# Patient Record
Sex: Female | Born: 1935 | Race: Black or African American | Hispanic: No | Smoking: Never smoker
Health system: Southern US, Community
[De-identification: ages and names within clinical notes are randomized; demographics above are authoritative.]

## PROBLEM LIST (undated history)

## (undated) DIAGNOSIS — M1990 Unspecified osteoarthritis, unspecified site: Secondary | ICD-10-CM

## (undated) DIAGNOSIS — D649 Anemia, unspecified: Secondary | ICD-10-CM

## (undated) DIAGNOSIS — I10 Essential (primary) hypertension: Secondary | ICD-10-CM

## (undated) HISTORY — DX: Essential (primary) hypertension: I10

## (undated) HISTORY — DX: Anemia, unspecified: D64.9

## (undated) HISTORY — DX: Unspecified osteoarthritis, unspecified site: M19.90

## (undated) HISTORY — PX: JOINT REPLACEMENT: SHX530

---

## 2008-12-12 HISTORY — PX: KNEE SURGERY: SHX244

## 2009-01-15 ENCOUNTER — Inpatient Hospital Stay (HOSPITAL_COMMUNITY): Admission: RE | Admit: 2009-01-15 | Discharge: 2009-01-19 | Payer: Self-pay | Admitting: Orthopedic Surgery

## 2009-02-10 ENCOUNTER — Encounter: Admission: RE | Admit: 2009-02-10 | Discharge: 2009-05-11 | Payer: Self-pay | Admitting: Orthopedic Surgery

## 2011-03-29 LAB — CBC
Hemoglobin: 7.8 g/dL — CL (ref 12.0–15.0)
Hemoglobin: 8.7 g/dL — ABNORMAL LOW (ref 12.0–15.0)
MCHC: 32.2 g/dL (ref 30.0–36.0)
MCHC: 33.7 g/dL (ref 30.0–36.0)
MCV: 88.6 fL (ref 78.0–100.0)
MCV: 88.8 fL (ref 78.0–100.0)
Platelets: 119 10*3/uL — ABNORMAL LOW (ref 150–400)
Platelets: 134 10*3/uL — ABNORMAL LOW (ref 150–400)
Platelets: 143 10*3/uL — ABNORMAL LOW (ref 150–400)
Platelets: 230 10*3/uL (ref 150–400)
RDW: 13.3 % (ref 11.5–15.5)
RDW: 13.3 % (ref 11.5–15.5)
RDW: 13.6 % (ref 11.5–15.5)
WBC: 7.3 10*3/uL (ref 4.0–10.5)
WBC: 8.3 10*3/uL (ref 4.0–10.5)

## 2011-03-29 LAB — BASIC METABOLIC PANEL
BUN: 20 mg/dL (ref 6–23)
BUN: 7 mg/dL (ref 6–23)
BUN: 8 mg/dL (ref 6–23)
BUN: 9 mg/dL (ref 6–23)
CO2: 27 mEq/L (ref 19–32)
CO2: 28 mEq/L (ref 19–32)
Calcium: 8.4 mg/dL (ref 8.4–10.5)
Calcium: 8.5 mg/dL (ref 8.4–10.5)
Calcium: 8.6 mg/dL (ref 8.4–10.5)
Calcium: 9.6 mg/dL (ref 8.4–10.5)
Chloride: 97 mEq/L (ref 96–112)
Chloride: 99 mEq/L (ref 96–112)
Creatinine, Ser: 0.72 mg/dL (ref 0.4–1.2)
Creatinine, Ser: 0.8 mg/dL (ref 0.4–1.2)
Creatinine, Ser: 0.84 mg/dL (ref 0.4–1.2)
GFR calc Af Amer: >60 mL/min (ref >60)
GFR calc Af Amer: >60 mL/min (ref >60)
GFR calc non Af Amer: >60 mL/min (ref >60)
GFR calc non Af Amer: >60 mL/min (ref >60)
Glucose, Bld: 108 mg/dL — ABNORMAL HIGH (ref 70–99)
Glucose, Bld: 130 mg/dL — ABNORMAL HIGH (ref 70–99)
Sodium: 132 mEq/L — ABNORMAL LOW (ref 135–145)

## 2011-03-29 LAB — PROTIME-INR
INR: 1.2 (ref 0.00–1.49)
INR: 1.5 (ref 0.00–1.49)
INR: 1.6 — ABNORMAL HIGH (ref 0.00–1.49)
Prothrombin Time: 14.9 seconds (ref 11.6–15.2)
Prothrombin Time: 15.6 seconds — ABNORMAL HIGH (ref 11.6–15.2)
Prothrombin Time: 19.1 seconds — ABNORMAL HIGH (ref 11.6–15.2)
Prothrombin Time: 21.7 seconds — ABNORMAL HIGH (ref 11.6–15.2)

## 2011-03-29 LAB — NO BLOOD PRODUCTS

## 2011-04-04 ENCOUNTER — Encounter: Payer: Self-pay | Admitting: Family Medicine

## 2011-04-04 DIAGNOSIS — D649 Anemia, unspecified: Secondary | ICD-10-CM

## 2011-04-04 DIAGNOSIS — I10 Essential (primary) hypertension: Secondary | ICD-10-CM | POA: Insufficient documentation

## 2011-04-26 NOTE — Discharge Summary (Signed)
Michelle Rangel, Michelle Rangel              ACCOUNT NO.:  [id number]   MEDICAL RECORD NO.:  [id number]          PATIENT TYPE:  INP   LOCATION:  5025                         FACILITY:  [facility]   PHYSICIAN:  Vania Rea. Supple, M.D.  DATE OF BIRTH:  01-05-1931   DATE OF ADMISSION:  01/15/2009  DATE OF DISCHARGE:  01/19/2009                               DISCHARGE SUMMARY   ADMISSION DIAGNOSES:  1. End-stage osteoarthrosis of left greater than right knee.  2. Hypertension.  3. The patient is Jehovah's Witness, therefore, no blood products.   DISCHARGE DIAGNOSES:  1. End-stage osteoarthrosis of left greater than right knee.  2. Hypertension.  3. The patient is Jehovah's Witness, therefore, no blood products.  4. Status post left total knee arthroplasty.  5. Postoperative hemorrhagic anemia, stable without transfusion per      patient's wishes.   OPERATION:  Left total knee arthroplasty.   SURGEON:  Vania Rea. Supple, MD   ASSISTANT:  Lucita Lora. Shuford, PA-C   ANESTHESIA:  General anesthetic with a femoral nerve block.   BRIEF HISTORY:  Michelle Rangel is a pleasant 77 year old female, who is  Jehovah's Witness, got end-stage osteoarthrosis of left knee.  She has  failed outpatient conservative measures and has had bone-on-bone  demonstrated on plain film x-rays.  At this time, total knee  arthroplasty is indicated due to her functional disabilities.  We  discussed options of total knee arthroplasty and she again expressed her  wishes for no blood products and we have agreed and wished to proceed.   HOSPITAL COURSE:  The patient was admitted and underwent the above-named  procedure and tolerated this well.  All appropriate IV antibiotics and  analgesics were utilized.  Postoperatively, she was placed on DVT and PE  prophylaxis with Coumadin as well as placed in physical therapy,  weightbearing as tolerated per total knee replacement protocol.  The  patient did experience a postoperative hemorrhagic  anemia first day to  8.7, second day to 7.6 and stabilized at 7.8.  She was minimally  symptomatic.  She did began working with formal physical therapy.  She  was placed on iron supplements.  She was noted to be slow to progress  initially, however, through days 3 and 4 postoperatively, she progressed  to the point where she was stable for discharge on date January 19, 2009.  On that date, she was afebrile.  Vital signs were stable.  She  was comfortable.  Incision was clean and dry.  She was ready for  discharge to home with Home Health, PT, OT, and care of her family.   LABORATORY DATA:  The above-named hemoglobins as above.  Otherwise,  protimes and INRs noted in the chart for monitoring of her Coumadin.  Chemistries showed within normal limits except for mild hyponatremia to  130-134 and borderline potassium at 3.4.  EKG in the chart showed normal  sinus rhythm with left ventricular hypertrophy, no old EKG to compare  to.   CONDITION ON DISCHARGE:  Stable and improved.   DISCHARGE MEDICATIONS AND PLAN:  The patient had been discharged to  home  with care of her family.  She will be on iron, which she does take on a  chronic basis.  She has Home Health Services with [facility] arranged for  home health PT, OT, RN.  Percocet and Robaxin and Coumadin prescriptions  are left.  Follow up in our office in 2 weeks.  May shower on day 5.   CONDITION ON DISCHARGE:  Stable and improved.  Resume other home meds  and home diet.      Tracy A. Shuford, P.A.-C.      Vania Rea. Supple, M.D.  Electronically Signed    TAS/MEDQ  D:  02/26/2009  T:  02/26/2009  Job:  [id number]

## 2011-04-26 NOTE — Op Note (Signed)
Michelle Rangel, Michelle Rangel              ACCOUNT NO.:  [id number]   MEDICAL RECORD NO.:  [id number]          PATIENT TYPE:  INP   LOCATION:  2899                         FACILITY:  [facility]   PHYSICIAN:  Vania Rea. Supple, M.D.  DATE OF BIRTH:  Aug 02, 1931   DATE OF PROCEDURE:  01/15/2009  DATE OF DISCHARGE:                               OPERATIVE REPORT   PREOPERATIVE DIAGNOSIS:  Left knee osteoarthrosis.   POSTOPERATIVE DIAGNOSIS:  Left knee osteoarthrosis.   PROCEDURE:  Cemented left total knee arthroplasty utilizing a DePuy  Sigma implant, a size 2 femur, a size 2.5 tibia, a 32 patellar button,  and a 12.5 rotating platform polyethylene insert.   SURGEON:  Vania Rea. Supple, MD   ASSISTANT:  Ralene Bathe, PA-C   ANESTHESIA:  General endotracheal as well as a femoral nerve block.   TOURNIQUET TIME:  46 minutes.   HEMOVAC:  Times one.   ESTIMATED BLOOD LOSS:  50 mL.   HISTORY:  Michelle Rangel is a 77 year old Jehovah's Witness who has end-stage  bilateral knee arthrosis, left symptomatically and radiographically more  severe than the right.  Due to ongoing pain and functional limitation,  she is brought to the operating room at this time for planned left total  knee arthroplasty.   We counseled Ms. Mcconathy and her family members regarding treatment  options and risks versus benefits thereof.  Possible surgical  complications including infection, neurovascular injury, DVT, PE,  persistent pain, and potential for acute blood loss and associated  complications were reviewed.  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  She understands and accepts and agrees with  plan for total knee arthroplasty.   PROCEDURE IN DETAIL:  After undergoing routine preop evaluation, the  patient was brought to the holding area and a femoral nerve block was  established by the Anesthesia Department.  She received prophylactic  antibiotics.  Brought to the operating room, placed supine  on the  operating table, and underwent smooth induction of general endotracheal  anesthesia.  Tourniquet applied to the left thigh and left leg was  sterilely prepped and draped in standard fashion.  Leg was exsanguinated  with the tourniquet inflated to 350 mmHg.  An anterior midline incision  was then made, centered over the patella approximately 15 cm in length.  Skin flaps were elevated and mobilized and the medial parapatellar  arthrotomy was then performed.  Patella was everted and the fat pad was  excised.  We performed a medial release of the tibia due to the varus  deformity and a large osteophyte was identified and resected on the  medial tibial plateau.  Patella was everted, knee was then flexed up,  and a drill was used to gain access into the femoral medullary canal.  An intramedullary guide was then passed and 11-mm resection was made  from the distal femur in a 5-degree valgus cut.  Distal femur was then  measured with a size 2 showing the best fit.  The size 2 cutting guide  was applied and we made the anterior, posterior, and chamfer cuts  on the  distal femur.  The size 2 femoral trial showed excellent fit.  Our  attention was then turned to the proximal tibia where we applied the  extramedullary guide and made a resection measuring 10 mm off the  lateral plateau using an oscillating saw and maintaining proper overall  alignment.  The menisci were removed prior to the proximal tibial  resection.  We then sized the proximal tibia which showed best coverage  with the 2.5 implant.  The 2.5 trial was pinned into position and then  we made a trial reduction that showed excellent soft tissue balance and  excellent knee motion with good stability.  We then completed the  preparation of proximal tibia with a reamer and the broach.  Attention  was returned to the distal femur where we used a box-cutting guide to  make a box cut and then performed a repeat trial with the boxed  implant  and this showed excellent fit and good soft tissue balance.  At this  point, we turned our attention to the patella which was exposed  circumferentially and measured and an 8-mm resection was performed and  the stabilizing drill holes were then placed with a 32-mm patellar  button.  At this point, the joint was copiously irrigated.  We  meticulously debrided the residual aspects of the menisci from the  posterior compartment and confirmed there were no residual osteophytes.  Soft tissue balance was confirmed.  The joint was pulsatile lavaged and  meticulously cleaned.  The cement was mixed and at the appropriate  consistency, all the implants were then cemented into position.  Meticulous removal of all residual cement was completed.  We then  performed repeat trial reductions and the 12.5 implant showed the best  soft tissue balance and full knee extension.  The final 12.5 mm implant  was placed and the knee was taken through range of motion showing  excellent mobility and instability.  At this point, the tourniquet was  then let down.  Hemostasis was obtained.  Hemovac drain was brought out  superolaterally.  The parapatellar arthrotomy was closed with  interrupted figure-of-eight #1 Vicryl sutures, 2-0 Vicryl was used to  close the subcu layer, and an intracuticular 3-0 Monocryl for the skin  followed by Steri-Strips.  A dry dressing was placed over the left knee  and leg was wrapped with Ace bandage and thigh high support stocking.  The patient was then awakened, extubated, and taken to the recovery room  in stable condition.      Vania Rea. Supple, M.D.  Electronically Signed     KMS/MEDQ  D:  01/15/2009  T:  01/16/2009  Job:  [id number]

## 2011-07-01 LAB — BASIC METABOLIC PANEL
Glucose: 104 mg/dL
Potassium: 4.7 mmol/L (ref 3.4–5.3)

## 2011-07-01 LAB — HEPATIC FUNCTION PANEL
ALT: 14 U/L (ref 7–35)
AST: 21 U/L (ref 13–35)

## 2011-07-12 ENCOUNTER — Encounter (INDEPENDENT_AMBULATORY_CARE_PROVIDER_SITE_OTHER): Payer: Self-pay | Admitting: *Deleted

## 2011-07-19 ENCOUNTER — Encounter (INDEPENDENT_AMBULATORY_CARE_PROVIDER_SITE_OTHER): Payer: Self-pay

## 2011-08-18 ENCOUNTER — Encounter (INDEPENDENT_AMBULATORY_CARE_PROVIDER_SITE_OTHER): Payer: Self-pay | Admitting: Internal Medicine

## 2011-08-18 ENCOUNTER — Ambulatory Visit (INDEPENDENT_AMBULATORY_CARE_PROVIDER_SITE_OTHER): Payer: Medicare Other | Admitting: Internal Medicine

## 2011-08-18 VITALS — BP 132/74 | HR 68 | Temp 97.0°F | Ht 62.0 in | Wt 163.3 lb

## 2011-08-18 DIAGNOSIS — D649 Anemia, unspecified: Secondary | ICD-10-CM

## 2011-08-18 NOTE — Progress Notes (Signed)
Subjective:     Patient ID: Michelle Rangel, female   DOB: 1931-06-23, 77 y.o.   MRN: [id number]  HPI  Michelle Rangel is a referral from Dr. Modesto Charon at [facility] for anemia.  She denies past history of anemia.  However back in February of 2010 H and H 7.8 and 22.9. 07/01/2011 H and H 10.7 and 32.0, MCV 89.5, Her fecal occult blood was negative. She is a Jehovah Witness.  No NSAIDS. No acid reflux. She denies weight loss. Appetite is average.  Denies abdominal pain.  She has a BM about 2 a week.  No melena. She tells me she saw blood in the commode after having a BM a few days.  Her last colonoscopy was 5 yrs ago in [place], [place], and it was normal.    Review of Systems  See hpi     Current Outpatient Prescriptions  Medication Sig Dispense Refill  . folic acid (FOLVITE) 1 MG tablet Take 800 mg by mouth daily.        Marland Kitchen glucosamine-chondroitin 500-400 MG tablet Take 1 tablet by mouth 3 (three) times daily.        Marland Kitchen loratadine (CLARITIN) 10 MG tablet Take 10 mg by mouth daily.        . hydrochlorothiazide 25 MG tablet Take 25 mg by mouth daily.         Past Medical History  Diagnosis Date  . Hypertension   . Anemia    Past Surgical History  Procedure Date  . Knee surgery 2010    Left total knee arthroplasty in 2008 or 2009   History   Social History Narrative  . No narrative on file   History   Social History  . Marital Status: Widowed    Spouse Name: N/A    Number of Children: N/A  . Years of Education: N/A   Occupational History  . Not on file.   Social History Main Topics  . Smoking status: Never Smoker   . Smokeless tobacco: Not on file  . Alcohol Use: No  . Drug Use: No  . Sexually Active: Not on file   Other Topics Concern  . Not on file   Social History Narrative  . No narrative on file   No family history on file.  Family Status  Relation Status Death Age  . Mother Deceased     DM  . Father Deceased     CVA  . Sister Other     Two alive in good  health. One deceased from an MI, One deceaed unknown cause.  . Child Alive     One son had a CVA, One son is on dialysis and is a diabetic  , One  has  had a stroke. One in good health.      Objective:   Physical Exam Filed Vitals:   08/18/11 1041  BP: 132/74  Pulse: 68  Temp: 97 F (36.1 C)   Filed Vitals:   08/18/11 1041  Height: 5\' 2"  (1.575 m)  Weight: 163 lb 4.8 oz (74.072 kg)    Alert and oriented. Skin warm and dry. Oral mucosa is moist.. Sclera anicteric, conjunctivae is pink. Thyroid not enlarged. No cervical lymphadenopathy. Lungs clear. Heart regular rate and rhythm.  Abdomen is soft. Bowel sounds are positive. No hepatomegaly. No abdominal masses felt. No tenderness.   Stool brown and guaiac negative.  No edema to lower extremities. Patient is alert and oriented.     Assessment:  Anemia. Stool guaiac negative. This does not appear to be a GI bleed.     Plan:    Iron studies.  Further recommendations once we have the iron studies. At present their is no indication for an EGD or a colonoscopy.  I discussed this case with Dr. Karilyn Cota.

## 2011-08-18 NOTE — Patient Instructions (Signed)
Pending Lab work.

## 2011-08-24 NOTE — Progress Notes (Signed)
3 mtth fu recall

## 2011-11-23 ENCOUNTER — Encounter (INDEPENDENT_AMBULATORY_CARE_PROVIDER_SITE_OTHER): Payer: Self-pay | Admitting: Internal Medicine

## 2011-11-23 ENCOUNTER — Ambulatory Visit (INDEPENDENT_AMBULATORY_CARE_PROVIDER_SITE_OTHER): Payer: Medicare Other | Admitting: Internal Medicine

## 2011-11-23 VITALS — BP 122/80 | HR 66 | Temp 98.1°F | Ht 62.0 in | Wt 163.5 lb

## 2011-11-23 DIAGNOSIS — D649 Anemia, unspecified: Secondary | ICD-10-CM

## 2011-11-23 NOTE — Progress Notes (Signed)
Subjective:     Patient ID: Michelle Rangel, female   DOB: 1931-10-13, 77 y.o.   MRN: [id number]  HPI Michelle Rangel is a 77 yr old black female here for a scheduled visit.  She was seen for hx of anemia. Her last visit she denied past hx of anemia.  In February of 2010 H and H 7.8 and 228.9, H and H 07/01/2011 10.7 and 32.0.  She is a Jehovah Witness.  Last colonoscopy was 5 yrs ago in [place], [place] and she reports it was normal. Appetite is good. No weight loss. She occasionally has abdominal pain.  She ha a BM about usually once a day. No bright red rectal bleeding or melena. She lives with her sister who is also seen for anemia.     Review of Systems  See hpi    Current Outpatient Prescriptions  Medication Sig Dispense Refill  . folic acid (FOLVITE) 1 MG tablet Take 800 mg by mouth daily.        Marland Kitchen glucosamine-chondroitin 500-400 MG tablet Take 1 tablet by mouth 3 (three) times daily.        . hydrochlorothiazide 25 MG tablet Take 25 mg by mouth daily.        Marland Kitchen loratadine (CLARITIN) 10 MG tablet Take 10 mg by mouth daily.         Past Medical History  Diagnosis Date  . Hypertension   . Anemia    Past Surgical History  Procedure Date  . Knee surgery 2010    Left total knee arthroplasty in 2008 or 2009   History   Social History  . Marital Status: Widowed    Spouse Name: N/A    Number of Children: N/A  . Years of Education: N/A   Occupational History  . Not on file.   Social History Main Topics  . Smoking status: Never Smoker   . Smokeless tobacco: Not on file  . Alcohol Use: No  . Drug Use: No  . Sexually Active: Not on file   Other Topics Concern  . Not on file   Social History Narrative  . No narrative on file   Family Status  Relation Status Death Age  . Mother Deceased     DM  . Father Deceased     CVA  . Sister Other     Two alive in good health. One deceased from an MI, One deceaed unknown cause.  . Child Alive     One son had a CVA, One son is on  dialysis and is a diabetic  , One  has  had a stroke. One in good health.    Allergies  Allergen Reactions  . Robitussin (Altarussin)     Objective:   Physical Exam  Filed Vitals:   11/23/11 1456  BP: 122/80  Pulse: 66  Temp: 98.1 F (36.7 C)  Height: 5\' 2"  (1.575 m)  Weight: 163 lb 8 oz (74.163 kg)    Alert and oriented. Skin warm and dry. Oral mucosa is moist. Upper dentures.. Sclera anicteric, conjunctivae is pink. Thyroid not enlarged. No cervical lymphadenopathy. Lungs clear. Heart regular rate and rhythm.  Abdomen is soft. Bowel sounds are positive. No hepatomegaly. No abdominal masses felt. No tenderness.  No edema to lower extremities. Patient is alert and oriented.      Assessment:   Anemia.  No GI symptoms. Patient is a Consulting civil engineer.  I spoke with Dr. Karilyn Cota in September about this case. Will monitor H  and H. Stools brown and guaiac negative.     Plan:   CBC today. OV in 6 months.

## 2011-11-23 NOTE — Patient Instructions (Signed)
OV in 6 months with a cbc

## 2011-11-24 LAB — CBC WITH DIFFERENTIAL/PLATELET
Eosinophils Relative: 5 % (ref 0–5)
HCT: 35.3 % — ABNORMAL LOW (ref 36.0–46.0)
Hemoglobin: 10.8 g/dL — ABNORMAL LOW (ref 12.0–15.0)
Lymphocytes Relative: 33 % (ref 12–46)
Lymphs Abs: 1.7 10*3/uL (ref 0.7–4.0)
MCV: 88.5 fL (ref 78.0–100.0)
Monocytes Absolute: 0.3 10*3/uL (ref 0.1–1.0)
Platelets: 230 10*3/uL (ref 150–400)
RBC: 3.99 MIL/uL (ref 3.87–5.11)
WBC: 5.1 10*3/uL (ref 4.0–10.5)

## 2011-12-01 ENCOUNTER — Telehealth (INDEPENDENT_AMBULATORY_CARE_PROVIDER_SITE_OTHER): Payer: Self-pay | Admitting: *Deleted

## 2011-12-01 DIAGNOSIS — D649 Anemia, unspecified: Secondary | ICD-10-CM

## 2011-12-01 NOTE — Telephone Encounter (Signed)
Per Delrae Rend the patient will need a CBC in 6 months

## 2012-05-11 ENCOUNTER — Other Ambulatory Visit (INDEPENDENT_AMBULATORY_CARE_PROVIDER_SITE_OTHER): Payer: Self-pay | Admitting: *Deleted

## 2012-05-11 ENCOUNTER — Encounter (INDEPENDENT_AMBULATORY_CARE_PROVIDER_SITE_OTHER): Payer: Self-pay | Admitting: *Deleted

## 2012-05-11 DIAGNOSIS — D649 Anemia, unspecified: Secondary | ICD-10-CM

## 2012-05-18 ENCOUNTER — Encounter (INDEPENDENT_AMBULATORY_CARE_PROVIDER_SITE_OTHER): Payer: Self-pay

## 2012-05-23 ENCOUNTER — Encounter (INDEPENDENT_AMBULATORY_CARE_PROVIDER_SITE_OTHER): Payer: Self-pay | Admitting: *Deleted

## 2012-05-30 ENCOUNTER — Ambulatory Visit (INDEPENDENT_AMBULATORY_CARE_PROVIDER_SITE_OTHER): Payer: Medicare Other | Admitting: Internal Medicine

## 2012-05-31 ENCOUNTER — Ambulatory Visit (INDEPENDENT_AMBULATORY_CARE_PROVIDER_SITE_OTHER): Payer: Medicare Other | Admitting: Internal Medicine

## 2012-05-31 ENCOUNTER — Encounter (INDEPENDENT_AMBULATORY_CARE_PROVIDER_SITE_OTHER): Payer: Self-pay | Admitting: Internal Medicine

## 2012-05-31 ENCOUNTER — Telehealth (INDEPENDENT_AMBULATORY_CARE_PROVIDER_SITE_OTHER): Payer: Self-pay | Admitting: *Deleted

## 2012-05-31 VITALS — BP 122/84 | HR 60 | Temp 98.1°F | Ht 62.0 in | Wt 162.9 lb

## 2012-05-31 DIAGNOSIS — D649 Anemia, unspecified: Secondary | ICD-10-CM

## 2012-05-31 LAB — CBC WITH DIFFERENTIAL/PLATELET
Basophils Relative: 1 % (ref 0–1)
HCT: 32.8 % — ABNORMAL LOW (ref 36.0–46.0)
Hemoglobin: 10.5 g/dL — ABNORMAL LOW (ref 12.0–15.0)
MCHC: 32 g/dL (ref 30.0–36.0)
Monocytes Absolute: 0.2 10*3/uL (ref 0.1–1.0)
Monocytes Relative: 6 % (ref 3–12)
Neutro Abs: 1.9 10*3/uL (ref 1.7–7.7)

## 2012-05-31 NOTE — Telephone Encounter (Signed)
Per Delrae Rend the patient will need to have lab work in 6 months This is noted for 11-29-2012 the same day as the patient's office visit. This is per the patient 's request.

## 2012-05-31 NOTE — Patient Instructions (Addendum)
CBC today. OV in 6 months with a CBC

## 2012-05-31 NOTE — Progress Notes (Signed)
Subjective:     Patient ID: Michelle Rangel, female   DOB: 10-26-1931, 78 y.o.   MRN: [id number]  HPI Michelle Rangel is a 78 yr old female here today for a scheduled visit. She has a hx of anemia.  In February of 2010 H and H 7.8.  07/01/2011 H and H 10.7 and 32.0. She is a Jehovah Witness. Her last colonoscopy was 5 yrs ago in [place], [place] and she reports it was normal.  She tells me she is doing okay. She denies weakness. Appetite is good. There has been no weight loss. No abdominal pain.  She usually has a BM about one every 2 days. No bright red rectal bleeding or melena.  Stools are brown and normal and normal caliber. H and H 11/23/2011 10.8 and 35.3.  CBC    Component Value Date/Time   WBC 5.1 11/23/2011 1504   WBC 4.0 07/01/2011   RBC 3.99 11/23/2011 1504   HGB 10.8* 11/23/2011 1504   HCT 35.3* 11/23/2011 1504   PLT 230 11/23/2011 1504   MCV 88.5 11/23/2011 1504   MCH 27.1 11/23/2011 1504   MCHC 30.6 11/23/2011 1504   RDW 13.3 11/23/2011 1504   LYMPHSABS 1.7 11/23/2011 1504   MONOABS 0.3 11/23/2011 1504   EOSABS 0.3 11/23/2011 1504   BASOSABS 0.0 11/23/2011 1504     Review of Systems see hpi Current Outpatient Prescriptions  Medication Sig Dispense Refill  . folic acid (FOLVITE) 1 MG tablet Take 800 mg by mouth daily.        Marland Kitchen glucosamine-chondroitin 500-400 MG tablet Take 1 tablet by mouth 3 (three) times daily.        . hydrochlorothiazide 25 MG tablet Take 25 mg by mouth daily.        Marland Kitchen loratadine (CLARITIN) 10 MG tablet Take 10 mg by mouth daily.         Past Medical History  Diagnosis Date  . Hypertension   . Anemia   . Hypertension    Past Surgical History  Procedure Date  . Knee surgery 2010    Left total knee arthroplasty in 2008 or 2009   swelling of the ankles History   Social History  . Marital Status: Widowed    Spouse Name: N/A    Number of Children: N/A  . Years of Education: N/A   Occupational History  . Not on file.   Social History  Main Topics  . Smoking status: Never Smoker   . Smokeless tobacco: Not on file  . Alcohol Use: No  . Drug Use: No  . Sexually Active: Not on file   Other Topics Concern  . Not on file   Social History Narrative  . No narrative on file   Family Status  Relation Status Death Age  . Mother Deceased     DM  . Father Deceased     CVA  . Sister Other     Two alive in good health. One deceased from an MI, One deceaed unknown cause.  . Child Alive     One son had a CVA, One son is on dialysis and is a diabetic  , One  has  had a stroke. One in good health.    Allergies  Allergen Reactions  . Robitussin (Guaifenesin)        Objective:   Physical Exam Filed Vitals:   05/31/12 1428  Height: 5\' 2"  (1.575 m)  Weight: 162 lb 14.4 oz (73.891 kg)   Alert  and oriented. Skin warm and dry. Oral mucosa is moist.   . Sclera anicteric, conjunctivae is pink. Thyroid not enlarged. No cervical lymphadenopathy. Lungs clear. Heart regular rate and rhythm.  Abdomen is soft. Bowel sounds are positive. No hepatomegaly. No abdominal masses felt. No tenderness. Stool brown and guaiac negative. No edema to lower extremities     Assessment:    Anemia. No GI symptoms. In December 2012 Hemoglobin was 10.8.  (I have discussed from a prior visit)    Plan:   CBC today and in 6 months. Will continue to monitor.

## 2012-06-23 ENCOUNTER — Encounter (HOSPITAL_COMMUNITY): Payer: Self-pay

## 2012-06-23 ENCOUNTER — Emergency Department (HOSPITAL_COMMUNITY)
Admission: EM | Admit: 2012-06-23 | Discharge: 2012-06-23 | Disposition: A | Discharge status: Home / Self Care | Payer: No Typology Code available for payment source | Attending: Emergency Medicine | Admitting: Emergency Medicine

## 2012-06-23 ENCOUNTER — Emergency Department (HOSPITAL_COMMUNITY): Payer: No Typology Code available for payment source

## 2012-06-23 DIAGNOSIS — S8010XA Contusion of unspecified lower leg, initial encounter: Secondary | ICD-10-CM | POA: Insufficient documentation

## 2012-06-23 DIAGNOSIS — I10 Essential (primary) hypertension: Secondary | ICD-10-CM | POA: Insufficient documentation

## 2012-06-23 DIAGNOSIS — M79609 Pain in unspecified limb: Secondary | ICD-10-CM | POA: Insufficient documentation

## 2012-06-23 DIAGNOSIS — S8011XA Contusion of right lower leg, initial encounter: Secondary | ICD-10-CM

## 2012-06-23 DIAGNOSIS — M25511 Pain in right shoulder: Secondary | ICD-10-CM

## 2012-06-23 DIAGNOSIS — Z96659 Presence of unspecified artificial knee joint: Secondary | ICD-10-CM | POA: Insufficient documentation

## 2012-06-23 DIAGNOSIS — M25519 Pain in unspecified shoulder: Secondary | ICD-10-CM | POA: Insufficient documentation

## 2012-06-23 DIAGNOSIS — Z79899 Other long term (current) drug therapy: Secondary | ICD-10-CM | POA: Insufficient documentation

## 2012-06-23 MED ORDER — ACETAMINOPHEN 325 MG PO TABS
650.0000 mg | ORAL_TABLET | Freq: Once | ORAL | Status: AC
  Administered 2012-06-23: 650 mg via ORAL
  Filled 2012-06-23: qty 2

## 2012-06-23 MED ORDER — TRAMADOL HCL 50 MG PO TABS: 50.0000 mg | ORAL_TABLET | Freq: Four times a day (QID) | ORAL | Status: AC | PRN

## 2012-06-23 MED ORDER — TRAMADOL HCL 50 MG PO TABS
50.0000 mg | ORAL_TABLET | Freq: Once | ORAL | Status: AC
  Administered 2012-06-23: 50 mg via ORAL
  Filled 2012-06-23: qty 1
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
## 2012-11-06 ENCOUNTER — Encounter (INDEPENDENT_AMBULATORY_CARE_PROVIDER_SITE_OTHER): Payer: Self-pay | Admitting: *Deleted

## 2012-11-06 ENCOUNTER — Telehealth (INDEPENDENT_AMBULATORY_CARE_PROVIDER_SITE_OTHER): Payer: Self-pay | Admitting: *Deleted

## 2012-11-06 DIAGNOSIS — D649 Anemia, unspecified: Secondary | ICD-10-CM

## 2012-11-06 NOTE — Telephone Encounter (Signed)
Lab order printed

## 2012-11-29 ENCOUNTER — Ambulatory Visit (INDEPENDENT_AMBULATORY_CARE_PROVIDER_SITE_OTHER): Payer: Medicare Other | Admitting: Internal Medicine

## 2012-11-29 ENCOUNTER — Encounter (INDEPENDENT_AMBULATORY_CARE_PROVIDER_SITE_OTHER): Payer: Self-pay | Admitting: Internal Medicine

## 2012-11-29 VITALS — BP 144/68 | HR 76 | Temp 97.5°F | Ht 60.0 in | Wt 160.5 lb

## 2012-11-29 DIAGNOSIS — D649 Anemia, unspecified: Secondary | ICD-10-CM

## 2012-11-29 NOTE — Progress Notes (Signed)
Subjective:     Patient ID: Michelle Rangel, female   DOB: 1931/11/11, 78 y.o.   MRN: [id number]  HPI Michelle Rangel is here for f/u of her anemia. She was referred to our office by Dr. Modesto Charon for anemia last year. Her last colonoscopy was 5 yrs ago in [place], [place] and it was normal. She tells me she has been all right. Appetite is good. No weight loss. No blood in her BMs. No melena. She is a [employer].  No NSAIDS. CBC    Component Value Date/Time   WBC 3.5* 05/31/2012 0207   WBC 4.0 07/01/2011   RBC 3.75* 05/31/2012 0207   HGB 10.5* 05/31/2012 0207   HCT 32.8* 05/31/2012 0207   PLT 233 05/31/2012 0207   MCV 87.5 05/31/2012 0207   MCH 28.0 05/31/2012 0207   MCHC 32.0 05/31/2012 0207   RDW 13.7 05/31/2012 0207   LYMPHSABS 1.2 05/31/2012 0207   MONOABS 0.2 05/31/2012 0207   EOSABS 0.2 05/31/2012 0207   BASOSABS 0.0 05/31/2012 0207      Review of Systems. Current Outpatient Prescriptions  Medication Sig Dispense Refill  . folic acid (FOLVITE) 1 MG tablet Take 800 mg by mouth daily.        Marland Kitchen glucosamine-chondroitin 500-400 MG tablet Take 1 tablet by mouth 3 (three) times daily.        . hydrochlorothiazide 25 MG tablet Take 25 mg by mouth daily.        Marland Kitchen loratadine (CLARITIN) 10 MG tablet Take 10 mg by mouth daily.         Past Medical History  Diagnosis Date  . Hypertension   . Anemia   . Hypertension    Past Surgical History  Procedure Date  . Knee surgery 2010    Left total knee arthroplasty in 2008 or 2009   Allergies  Allergen Reactions  . Robitussin (Guaifenesin)        Objective:   Physical Exam Filed Vitals:   11/29/12 1414  BP: 144/68  Pulse: 76  Temp: 97.5 F (36.4 C)  Height: 5' (1.524 m)  Weight: 160 lb 8 oz (72.802 kg)   Alert and oriented. Skin warm and dry. Oral mucosa is moist.   . Sclera anicteric, conjunctivae is pink. Thyroid not enlarged. No cervical lymphadenopathy. Lungs clear. Heart regular rate and rhythm.  Abdomen is soft. Bowel sounds are  positive. No hepatomegaly. No abdominal masses felt. No tenderness.  No edema to lower extremities.        Assessment:    Anemia. No GI symptoms. I discussed this case earlier with Dr. Karilyn Cota    Plan:     CBC. OV in 6 months.

## 2012-11-29 NOTE — Patient Instructions (Signed)
CBC and OV in 6 months.

## 2012-11-30 LAB — CBC WITH DIFFERENTIAL/PLATELET
Basophils Absolute: 0 10*3/uL (ref 0.0–0.1)
Eosinophils Absolute: 0.4 10*3/uL (ref 0.0–0.7)
Eosinophils Relative: 9 % — ABNORMAL HIGH (ref 0–5)
MCH: 28.1 pg (ref 26.0–34.0)
MCV: 85.7 fL (ref 78.0–100.0)
Platelets: 245 10*3/uL (ref 150–400)
RDW: 13.8 % (ref 11.5–15.5)
WBC: 3.7 10*3/uL — ABNORMAL LOW (ref 4.0–10.5)

## 2013-05-16 ENCOUNTER — Other Ambulatory Visit: Payer: Self-pay | Admitting: Family Medicine

## 2013-05-30 ENCOUNTER — Ambulatory Visit (INDEPENDENT_AMBULATORY_CARE_PROVIDER_SITE_OTHER): Payer: Medicare Other | Admitting: Internal Medicine

## 2013-06-26 ENCOUNTER — Other Ambulatory Visit: Payer: Self-pay | Admitting: Family Medicine

## 2013-06-28 ENCOUNTER — Telehealth: Payer: Self-pay | Admitting: Family Medicine

## 2013-06-28 NOTE — Telephone Encounter (Signed)
Left message for pt to return call.

## 2013-07-01 NOTE — Telephone Encounter (Signed)
Left another message to return call for appt

## 2013-07-04 ENCOUNTER — Encounter: Payer: Self-pay | Admitting: Family Medicine

## 2013-07-04 ENCOUNTER — Ambulatory Visit (INDEPENDENT_AMBULATORY_CARE_PROVIDER_SITE_OTHER): Payer: Medicare Other

## 2013-07-04 ENCOUNTER — Ambulatory Visit (INDEPENDENT_AMBULATORY_CARE_PROVIDER_SITE_OTHER): Payer: Medicare Other | Admitting: Family Medicine

## 2013-07-04 VITALS — BP 140/71 | HR 70 | Temp 97.2°F | Wt 155.6 lb

## 2013-07-04 DIAGNOSIS — M25579 Pain in unspecified ankle and joints of unspecified foot: Secondary | ICD-10-CM | POA: Insufficient documentation

## 2013-07-04 DIAGNOSIS — I10 Essential (primary) hypertension: Secondary | ICD-10-CM | POA: Insufficient documentation

## 2013-07-04 DIAGNOSIS — M25571 Pain in right ankle and joints of right foot: Secondary | ICD-10-CM

## 2013-07-04 DIAGNOSIS — D649 Anemia, unspecified: Secondary | ICD-10-CM

## 2013-07-04 DIAGNOSIS — J302 Other seasonal allergic rhinitis: Secondary | ICD-10-CM

## 2013-07-04 DIAGNOSIS — J309 Allergic rhinitis, unspecified: Secondary | ICD-10-CM

## 2013-07-04 LAB — POCT CBC
Granulocyte percent: 58 %G (ref 37–80)
HCT, POC: 33.7 % — AB (ref 37.7–47.9)
Hemoglobin: 10.6 g/dL — AB (ref 12.2–16.2)
Lymph, poc: 1.3 (ref 0.6–3.4)
MCH, POC: 27 pg (ref 27–31.2)
MCHC: 31.6 g/dL — AB (ref 31.8–35.4)
MCV: 85.6 fL (ref 80–97)
MPV: 8.3 fL (ref 0–99.8)
POC Granulocyte: 2.2 (ref 2–6.9)
POC LYMPH PERCENT: 35.2 %L (ref 10–50)
Platelet Count, POC: 196 10*3/uL (ref 142–424)
RBC: 3.9 M/uL — AB (ref 4.04–5.48)
RDW, POC: 13.8 %
WBC: 3.8 10*3/uL — AB (ref 4.6–10.2)

## 2013-07-04 LAB — COMPLETE METABOLIC PANEL WITH GFR
ALT: 10 U/L (ref 0–35)
AST: 16 U/L (ref 0–37)
Albumin: 4.1 g/dL (ref 3.5–5.2)
Alkaline Phosphatase: 80 U/L (ref 39–117)
BUN: 21 mg/dL (ref 6–23)
CO2: 31 mEq/L (ref 19–32)
Calcium: 9.6 mg/dL (ref 8.4–10.5)
Chloride: 105 mEq/L (ref 96–112)
Creat: 0.96 mg/dL (ref 0.50–1.10)
GFR, Est African American: 65 mL/min
GFR, Est Non African American: 56 mL/min — ABNORMAL LOW
Glucose, Bld: 94 mg/dL (ref 70–99)
Potassium: 4.8 mEq/L (ref 3.5–5.3)
Sodium: 141 mEq/L (ref 135–145)
Total Bilirubin: 0.6 mg/dL (ref 0.3–1.2)
Total Protein: 6.9 g/dL (ref 6.0–8.3)

## 2013-07-04 LAB — URIC ACID: Uric Acid, Serum: 5.7 mg/dL (ref 2.4–6.0)

## 2013-07-04 MED ORDER — HYDROCHLOROTHIAZIDE 25 MG PO TABS: 25.0000 mg | ORAL_TABLET | Freq: Every day | ORAL | Status: DC

## 2013-07-04 MED ORDER — LORATADINE 10 MG PO TABS: ORAL_TABLET | ORAL | Status: DC

## 2013-07-04 NOTE — Progress Notes (Signed)
Patient ID: Michelle Rangel, female   DOB: 1931-06-30, 79 y.o.   MRN: [id number] SUBJECTIVE: CC: Chief Complaint  Patient presents with  . Follow-up    6 month follow up    HPI. Patient is here for follow up of hypertension:/anemia/right Osteoarthritis: denies Headache;deniesChest Pain;denies weakness;denies Shortness of Breath or Orthopnea;denies Visual changes;denies palpitations;denies cough;denies pedal edema;denies symptoms of TIA or stroke; admits to Compliance with medications. denies Problems with medications.  Past Medical History  Diagnosis Date  . Hypertension   . Anemia   . Hypertension   . right knee    Past Surgical History  Procedure Laterality Date  . Knee surgery  2010    Left total knee arthroplasty in 2008 or 2009  . Joint replacement Left     knee   History   Social History  . Marital Status: Widowed    Spouse Name: N/A    Number of Children: N/A  . Years of Education: N/A   Occupational History  . Not on file.   Social History Main Topics  . Smoking status: Never Smoker   . Smokeless tobacco: Not on file  . Alcohol Use: No  . Drug Use: No  . Sexually Active: No   Other Topics Concern  . Not on file   Social History Narrative  . No narrative on file   No family history on file. Current Outpatient Prescriptions on File Prior to Visit  Medication Sig Dispense Refill  . folic acid (FOLVITE) 1 MG tablet Take 800 mg by mouth daily.       Marland Kitchen glucosamine-chondroitin 500-400 MG tablet Take 1 tablet by mouth 3 (three) times daily.        . hydrochlorothiazide 25 MG tablet Take 25 mg by mouth daily.        Marland Kitchen loratadine (CLARITIN) 10 MG tablet TAKE ONE TABLET BY MOUTH ONCE DAILY  30 tablet  1   No current facility-administered medications on file prior to visit.   Allergies  Allergen Reactions  . Robitussin (Guaifenesin)     There is no immunization history on file for this patient. Prior to Admission medications   Medication Sig Start Date  End Date Taking? Authorizing Provider  folic acid (FOLVITE) 1 MG tablet Take 800 mg by mouth daily.    Yes Historical Provider, MD  glucosamine-chondroitin 500-400 MG tablet Take 1 tablet by mouth 3 (three) times daily.     Yes Historical Provider, MD  hydrochlorothiazide 25 MG tablet Take 25 mg by mouth daily.     Yes Historical Provider, MD  loratadine (CLARITIN) 10 MG tablet TAKE ONE TABLET BY MOUTH ONCE DAILY 06/26/13  Yes Ileana Ladd, MD     ROS: As above in the HPI. All other systems are stable or negative.  OBJECTIVE: APPEARANCE:  Patient in no acute distress.The patient appeared well nourished and normally developed. Acyanotic. Waist: VITAL SIGNS:BP 140/71  Pulse 70  Temp(Src) 97.2 F (36.2 C) (Oral)  Wt 155 lb 9.6 oz (70.58 kg)  BMI 30.39 kg/m2  AAFobese short stature  SKIN: warm and  Dry without overt rashes, tattoos and scars  HEAD and Neck: without JVD, Head and scalp: normal Eyes:No scleral icterus. Fundi normal, eye movements normal. Ears: Auricle normal, canal normal, Tympanic membranes normal, insufflation normal. Nose: normal Throat: normal Neck & thyroid: normal  CHEST & LUNGS: Chest wall: normal Lungs: Clear  CVS: Reveals the PMI to be normally located. Regular rhythm, First and Second Heart sounds are normal,  absence of murmurs, rubs or gallops. Peripheral vasculature: Radial pulses: normal Dorsal pedis pulses: normal Posterior pulses: normal  ABDOMEN:  Appearance: obese Benign, no organomegaly, no masses, no Abdominal Aortic enlargement. No Guarding , no rebound. No Bruits. Bowel sounds: normal  RECTAL: N/A GU: N/A  EXTREMETIES: ankle edema  MUSCULOSKELETAL:  Spine: normal Joints: right knee crepitus severe DJD of the right knee  NEUROLOGIC: oriented to time,place and person; nonfocal. Strength is normal Sensory is normal Reflexes are normal Cranial Nerves are normal.   Results for orders placed in visit on 07/04/13  POCT  CBC      Result Value Range   WBC 3.8 (*) 4.6 - 10.2 K/uL   Lymph, poc 1.3  0.6 - 3.4   POC LYMPH PERCENT 35.2  10 - 50 %L   POC Granulocyte 2.2  2 - 6.9   Granulocyte percent 58.0  37 - 80 %G   RBC 3.9 (*) 4.04 - 5.48 M/uL   Hemoglobin 10.6 (*) 12.2 - 16.2 g/dL   HCT, POC 16.1 (*) 09.6 - 47.9 %   MCV 85.6  80 - 97 fL   MCH, POC 27.0  27 - 31.2 pg   MCHC 31.6 (*) 31.8 - 35.4 g/dL   RDW, POC 04.5     Platelet Count, POC 196.0  142 - 424 K/uL   MPV 8.3  0 - 99.8 fL    ASSESSMENT: HTN (hypertension) - Plan: COMPLETE METABOLIC PANEL WITH GFR, hydrochlorothiazide (HYDRODIURIL) 25 MG tablet  Anemia - Plan: POCT CBC  Pain in joint, ankle and foot, right - Plan: Uric acid, DG Ankle Complete Right  Seasonal allergic rhinitis - Plan: loratadine (CLARITIN) 10 MG tablet   PLAN: [facility] reading (PRIMARY) by  Dr. Modesto Charon: mile degenerative changes and soft tissue swelling. Await official reading.  Orders Placed This Encounter  Procedures  . DG Ankle Complete Right    Standing Status: Future     Number of Occurrences: 1     Standing Expiration Date: 09/03/2014    Order Specific Question:  Reason for Exam (SYMPTOM  OR DIAGNOSIS REQUIRED)    Answer:  right ankle  swelling and pain.    Order Specific Question:  Preferred imaging location?    Answer:  Internal  . COMPLETE METABOLIC PANEL WITH GFR  . Uric acid  . POCT CBC   Meds ordered this encounter  Medications  . loratadine (CLARITIN) 10 MG tablet    Sig: TAKE ONE TABLET BY MOUTH ONCE DAILY    Dispense:  30 tablet    Refill:  5  . hydrochlorothiazide (HYDRODIURIL) 25 MG tablet    Sig: Take 1 tablet (25 mg total) by mouth daily.    Dispense:  90 tablet    Refill:  3     Recommend Ice Elevate right ankle. Await results of xrays and labs.  Return in about 3 months (around 10/04/2013) for Recheck medical problems.  Kenedee Molesky P. Modesto Charon, M.D.     .

## 2013-09-13 ENCOUNTER — Encounter: Payer: Self-pay | Admitting: Family Medicine

## 2013-09-13 ENCOUNTER — Ambulatory Visit (INDEPENDENT_AMBULATORY_CARE_PROVIDER_SITE_OTHER): Payer: Medicare Other | Admitting: Family Medicine

## 2013-09-13 ENCOUNTER — Ambulatory Visit (INDEPENDENT_AMBULATORY_CARE_PROVIDER_SITE_OTHER): Payer: Medicare Other

## 2013-09-13 VITALS — BP 150/73 | HR 96 | Temp 97.2°F

## 2013-09-13 DIAGNOSIS — R109 Unspecified abdominal pain: Secondary | ICD-10-CM

## 2013-09-13 DIAGNOSIS — K5901 Slow transit constipation: Secondary | ICD-10-CM

## 2013-09-13 DIAGNOSIS — J309 Allergic rhinitis, unspecified: Secondary | ICD-10-CM

## 2013-09-13 DIAGNOSIS — J302 Other seasonal allergic rhinitis: Secondary | ICD-10-CM

## 2013-09-13 DIAGNOSIS — K649 Unspecified hemorrhoids: Secondary | ICD-10-CM

## 2013-09-13 LAB — POCT CBC
Granulocyte percent: 61.6 %G (ref 37–80)
HCT, POC: 35.9 % — AB (ref 37.7–47.9)
Hemoglobin: 11.6 g/dL — AB (ref 12.2–16.2)
Lymph, poc: 1.3 (ref 0.6–3.4)
MCH, POC: 27.5 pg (ref 27–31.2)
MCHC: 32.4 g/dL (ref 31.8–35.4)
MCV: 84.8 fL (ref 80–97)
MPV: 8.5 fL (ref 0–99.8)
POC Granulocyte: 2.7 (ref 2–6.9)
POC LYMPH PERCENT: 29.5 %L (ref 10–50)
Platelet Count, POC: 218 10*3/uL (ref 142–424)
RBC: 4.2 M/uL (ref 4.04–5.48)
RDW, POC: 13.2 %
WBC: 4.4 10*3/uL — AB (ref 4.6–10.2)

## 2013-09-13 MED ORDER — STARCH 51 % RE SUPP: 1.0000 | RECTAL | Status: AC | PRN

## 2013-09-13 MED ORDER — LORATADINE 10 MG PO TABS: ORAL_TABLET | ORAL | Status: DC

## 2013-09-13 MED ORDER — POLYETHYLENE GLYCOL 3350 17 GM/SCOOP PO POWD: 17.0000 g | Freq: Two times a day (BID) | ORAL | Status: AC | PRN

## 2013-09-13 NOTE — Progress Notes (Signed)
Patient ID: Michelle Rangel, female   DOB: 1931/10/20, 79 y.o.   MRN: [id number] SUBJECTIVE: CC: Chief Complaint  Patient presents with  . Follow-up  . Medication Refill    HPI: Constipated. Rectum painful Patient is here for follow up of hypertension: denies Headache;deniesChest Pain;denies weakness;denies Shortness of Breath or Orthopnea;denies Visual changes;denies palpitations;denies cough;denies pedal edema;denies symptoms of TIA or stroke; admits to Compliance with medications. denies Problems with medications.  More forgetful.  Past Medical History  Diagnosis Date  . Hypertension   . Anemia   . Hypertension   . right knee    Past Surgical History  Procedure Laterality Date  . Knee surgery  2010    Left total knee arthroplasty in 2008 or 2009  . Joint replacement Left     knee   History   Social History  . Marital Status: Widowed    Spouse Name: N/A    Number of Children: N/A  . Years of Education: N/A   Occupational History  . Not on file.   Social History Main Topics  . Smoking status: Never Smoker   . Smokeless tobacco: Not on file  . Alcohol Use: No  . Drug Use: No  . Sexual Activity: No   Other Topics Concern  . Not on file   Social History Narrative  . No narrative on file   No family history on file. Current Outpatient Prescriptions on File Prior to Visit  Medication Sig Dispense Refill  . folic acid (FOLVITE) 1 MG tablet Take 800 mg by mouth daily.       Marland Kitchen glucosamine-chondroitin 500-400 MG tablet Take 1 tablet by mouth 3 (three) times daily.        . hydrochlorothiazide (HYDRODIURIL) 25 MG tablet Take 1 tablet (25 mg total) by mouth daily.  90 tablet  3  . loratadine (CLARITIN) 10 MG tablet TAKE ONE TABLET BY MOUTH ONCE DAILY  30 tablet  5   No current facility-administered medications on file prior to visit.   Allergies  Allergen Reactions  . Robitussin [Guaifenesin]     There is no immunization history on file for this  patient. Prior to Admission medications   Medication Sig Start Date End Date Taking? Authorizing Provider  folic acid (FOLVITE) 1 MG tablet Take 800 mg by mouth daily.    Yes Historical Provider, MD  glucosamine-chondroitin 500-400 MG tablet Take 1 tablet by mouth 3 (three) times daily.     Yes Historical Provider, MD  hydrochlorothiazide (HYDRODIURIL) 25 MG tablet Take 1 tablet (25 mg total) by mouth daily. 07/04/13  Yes Ileana Ladd, MD  loratadine (CLARITIN) 10 MG tablet TAKE ONE TABLET BY MOUTH ONCE DAILY 07/04/13  Yes Ileana Ladd, MD    ROS: As above in the HPI. All other systems are stable or negative.  OBJECTIVE: APPEARANCE:  Patient in no acute distress.The patient appeared well nourished and normally developed. Acyanotic. Waist: VITAL SIGNS:BP 150/73  Pulse 96  Temp(Src) 97.2 F (36.2 C) (Oral) AAF  SKIN: warm and  Dry without overt rashes, tattoos and scars  HEAD and Neck: without JVD, Head and scalp: normal Eyes:No scleral icterus. Fundi normal, eye movements normal. Ears: Auricle normal, canal normal, Tympanic membranes normal, insufflation normal. Nose: nasal congestion Throat: normal Neck & thyroid: normal  CHEST & LUNGS: Chest wall: normal Lungs: Clear  CVS: Reveals the PMI to be normally located. Regular rhythm, First and Second Heart sounds are normal,  absence of murmurs, rubs or gallops.  Peripheral vasculature: Radial pulses: normal Dorsal pedis pulses: normal Posterior pulses: normal  ABDOMEN:  Appearance: obese Benign, no organomegaly, no masses, no Abdominal Aortic enlargement. No Guarding , no rebound. No Bruits. Bowel sounds: normal  RECTAL: hemorrhoid at the 5 oclock area with small fissure very tender. Rectal vault full of  Stools. Manually disempacted.  GU: N/A  EXTREMETIES: nonedematous.  MUSCULOSKELETAL:  Spine: normal Joints: arthriticright knee with crepitus  NEUROLOGIC: oriented to time,place and person;  nonfocal. Strength is normal Sensory is normal Reflexes are normal Cranial Nerves are normal.  ASSESSMENT: Abdominal  pain, other specified site - Plan: POCT CBC, Amylase, CMP14+EGFR, Lipase, DG Abd 2 Views  Constipation, slow transit - Plan: polyethylene glycol powder (GLYCOLAX/MIRALAX) powder  Hemorrhoid - Plan: starch (ANUSOL) 51 % suppository  Seasonal allergic rhinitis - Plan: loratadine (CLARITIN) 10 MG tablet   PLAN: Orders Placed This Encounter  Procedures  . DG Abd 2 Views    Standing Status: Future     Number of Occurrences: 1     Standing Expiration Date: 11/13/2014    Order Specific Question:  Reason for Exam (SYMPTOM  OR DIAGNOSIS REQUIRED)    Answer:  abdominal pain and constipation    Order Specific Question:  Preferred imaging location?    Answer:  Internal  . Amylase  . CMP14+EGFR  . Lipase  . POCT CBC    [facility] reading (PRIMARY) by  Dr. Modesto Charon: full of stools. No obstruction. No airfluid levels. C/w constipation.  Meds ordered this encounter  Medications  . starch (ANUSOL) 51 % suppository    Sig: Place 1 suppository rectally as needed for pain.    Dispense:  24 suppository    Refill:  0  . polyethylene glycol powder (GLYCOLAX/MIRALAX) powder    Sig: Take 17 g by mouth 2 (two) times daily as needed.    Dispense:  3350 g    Refill:  1  . loratadine (CLARITIN) 10 MG tablet    Sig: TAKE ONE TABLET BY MOUTH ONCE DAILY    Dispense:  30 tablet    Refill:  5   High fiber diet and adequate hydration.  Return in about 4 days (around 09/17/2013) for Recheck medical problems.  Jonathen Rathman P. Modesto Charon, M.D.

## 2013-09-14 LAB — CMP14+EGFR
ALT: 11 IU/L (ref 0–32)
AST: 17 IU/L (ref 0–40)
Albumin/Globulin Ratio: 1.6 (ref 1.1–2.5)
Albumin: 4.6 g/dL (ref 3.5–4.7)
Alkaline Phosphatase: 84 IU/L (ref 39–117)
BUN/Creatinine Ratio: 9 — ABNORMAL LOW (ref 11–26)
BUN: 8 mg/dL (ref 8–27)
CO2: 27 mmol/L (ref 18–29)
Calcium: 10.1 mg/dL (ref 8.6–10.2)
Chloride: 100 mmol/L (ref 97–108)
Creatinine, Ser: 0.88 mg/dL (ref 0.57–1.00)
GFR calc Af Amer: 72 mL/min/{1.73_m2} (ref >59)
GFR calc non Af Amer: 62 mL/min/{1.73_m2} (ref >59)
Globulin, Total: 2.9 g/dL (ref 1.5–4.5)
Glucose: 81 mg/dL (ref 65–99)
Potassium: 4.3 mmol/L (ref 3.5–5.2)
Sodium: 143 mmol/L (ref 134–144)
Total Bilirubin: 0.7 mg/dL (ref 0.0–1.2)
Total Protein: 7.5 g/dL (ref 6.0–8.5)

## 2013-09-14 LAB — AMYLASE: Amylase: 58 U/L (ref 31–124)

## 2013-09-14 LAB — LIPASE: Lipase: 11 U/L (ref 0–59)

## 2013-09-16 ENCOUNTER — Encounter: Payer: Self-pay | Admitting: Family Medicine

## 2013-09-16 ENCOUNTER — Ambulatory Visit (INDEPENDENT_AMBULATORY_CARE_PROVIDER_SITE_OTHER): Payer: Medicare Other | Admitting: Family Medicine

## 2013-09-16 VITALS — BP 144/78 | HR 77 | Temp 97.0°F | Wt 153.0 lb

## 2013-09-16 DIAGNOSIS — K644 Residual hemorrhoidal skin tags: Secondary | ICD-10-CM | POA: Insufficient documentation

## 2013-09-16 DIAGNOSIS — K5901 Slow transit constipation: Secondary | ICD-10-CM | POA: Insufficient documentation

## 2013-09-16 DIAGNOSIS — R109 Unspecified abdominal pain: Secondary | ICD-10-CM | POA: Insufficient documentation

## 2013-09-16 NOTE — Progress Notes (Signed)
Patient ID: Michelle Rangel, female   DOB: [birth year], 79 y.o.   MRN: [id number] SUBJECTIVE: CC: Chief Complaint  Patient presents with  . Follow-up    3 day reck constipation could not afford the supp. bowels moving well     HPI: Here for  Recheck of constipation and fissured hemorrhoid. rectum better. Using Preparation-H instead because of cost. Stools now loose and has 2 to 3 watery loose stools daily. Pain is better. Using the miralax twice  A day.  Past Medical History  Diagnosis Date  . Hypertension   . Anemia   . Hypertension   . right knee    Past Surgical History  Procedure Laterality Date  . Knee surgery  2010    Left total knee arthroplasty in 2008 or 2009  . Joint replacement Left     knee   History   Social History  . Marital Status: Widowed    Spouse Name: N/A    Number of Children: N/A  . Years of Education: N/A   Occupational History  . Not on file.   Social History Main Topics  . Smoking status: Never Smoker   . Smokeless tobacco: Not on file  . Alcohol Use: No  . Drug Use: No  . Sexual Activity: No   Other Topics Concern  . Not on file   Social History Narrative  . No narrative on file   No family history on file. Current Outpatient Prescriptions on File Prior to Visit  Medication Sig Dispense Refill  . folic acid (FOLVITE) 1 MG tablet Take 800 mg by mouth daily.       Marland Kitchen glucosamine-chondroitin 500-400 MG tablet Take 1 tablet by mouth 3 (three) times daily.        . hydrochlorothiazide (HYDRODIURIL) 25 MG tablet Take 1 tablet (25 mg total) by mouth daily.  90 tablet  3  . loratadine (CLARITIN) 10 MG tablet TAKE ONE TABLET BY MOUTH ONCE DAILY  30 tablet  5  . polyethylene glycol powder (GLYCOLAX/MIRALAX) powder Take 17 g by mouth 2 (two) times daily as needed.  3350 g  1  . starch (ANUSOL) 51 % suppository Place 1 suppository rectally as needed for pain.  24 suppository  0   No current facility-administered medications on file prior to  visit.   Allergies  Allergen Reactions  . Robitussin [Guaifenesin]     There is no immunization history on file for this patient. Prior to Admission medications   Medication Sig Start Date End Date Taking? Authorizing Provider  folic acid (FOLVITE) 1 MG tablet Take 800 mg by mouth daily.     Historical Provider, MD  glucosamine-chondroitin 500-400 MG tablet Take 1 tablet by mouth 3 (three) times daily.      Historical Provider, MD  hydrochlorothiazide (HYDRODIURIL) 25 MG tablet Take 1 tablet (25 mg total) by mouth daily. 07/04/13   Ileana Ladd, MD  loratadine (CLARITIN) 10 MG tablet TAKE ONE TABLET BY MOUTH ONCE DAILY 09/13/13   Ileana Ladd, MD  polyethylene glycol powder (GLYCOLAX/MIRALAX) powder Take 17 g by mouth 2 (two) times daily as needed. 09/13/13   Ileana Ladd, MD  starch (ANUSOL) 51 % suppository Place 1 suppository rectally as needed for pain. 09/13/13   Ileana Ladd, MD     ROS: As above in the HPI. All other systems are stable or negative.  OBJECTIVE: APPEARANCE:  Patient in no acute distress.The patient appeared well nourished and normally developed. Acyanotic. Waist:  VITAL SIGNS:BP 144/78  Pulse 77  Temp(Src) 97 F (36.1 C) (Oral)  Wt 153 lb (69.4 kg)  BMI 29.88 kg/m2 AAF  SKIN: warm and  Dry without overt rashes, tattoos and scars  HEAD and Neck: without JVD, Head and scalp: normal Eyes:No scleral icterus. Fundi normal, eye movements normal. Ears: Auricle normal, canal normal, Tympanic membranes normal, insufflation normal. Nose: normal Throat: normal Neck & thyroid: normal  CHEST & LUNGS: Chest wall: normal Lungs: Clear  CVS: Reveals the PMI to be normally located. Regular rhythm, First and Second Heart sounds are normal,  absence of murmurs, rubs or gallops. Peripheral vasculature: Radial pulses: normal Dorsal pedis pulses: normal Posterior pulses: normal  ABDOMEN:  Appearance: obese,minimal discomfort. Benign, no organomegaly, no  masses, no Abdominal Aortic enlargement. No Guarding , no rebound. No Bruits. Bowel sounds: normal  RECTAL: N/A GU: N/A  EXTREMETIES: nonedematous.  NEUROLOGIC: oriented to time,place and person; nonfocal.  ASSESSMENT: Constipation, slow transit - resolved  External hemorrhoid - improving  Abdominal  pain, other specified site - better  PLAN: Continue with preparation-H and stop the miralax and use prn.  RTc in 2 months for  Routine follow up or before if any problems.   Parker Sawatzky P. Modesto Charon, M.D.

## 2013-11-22 ENCOUNTER — Ambulatory Visit: Payer: Medicare Other | Admitting: Family Medicine

## 2014-01-24 ENCOUNTER — Encounter: Payer: Self-pay | Admitting: Family Medicine

## 2014-01-24 ENCOUNTER — Ambulatory Visit (INDEPENDENT_AMBULATORY_CARE_PROVIDER_SITE_OTHER): Payer: Medicare Other | Admitting: Family Medicine

## 2014-01-24 ENCOUNTER — Ambulatory Visit (INDEPENDENT_AMBULATORY_CARE_PROVIDER_SITE_OTHER): Payer: Medicare Other

## 2014-01-24 ENCOUNTER — Other Ambulatory Visit: Payer: Self-pay | Admitting: Family Medicine

## 2014-01-24 ENCOUNTER — Ambulatory Visit
Admission: RE | Admit: 2014-01-24 | Discharge: 2014-01-24 | Disposition: A | Discharge status: Home / Self Care | Payer: Self-pay | Source: Ambulatory Visit | Attending: Family Medicine | Admitting: Family Medicine

## 2014-01-24 VITALS — BP 154/83 | HR 67 | Temp 97.2°F | Ht 62.0 in | Wt 155.4 lb

## 2014-01-24 DIAGNOSIS — M1711 Unilateral primary osteoarthritis, right knee: Secondary | ICD-10-CM | POA: Insufficient documentation

## 2014-01-24 DIAGNOSIS — R52 Pain, unspecified: Secondary | ICD-10-CM

## 2014-01-24 DIAGNOSIS — K5901 Slow transit constipation: Secondary | ICD-10-CM

## 2014-01-24 DIAGNOSIS — IMO0002 Reserved for concepts with insufficient information to code with codable children: Secondary | ICD-10-CM

## 2014-01-24 DIAGNOSIS — M25569 Pain in unspecified knee: Secondary | ICD-10-CM

## 2014-01-24 DIAGNOSIS — J302 Other seasonal allergic rhinitis: Secondary | ICD-10-CM

## 2014-01-24 DIAGNOSIS — L219 Seborrheic dermatitis, unspecified: Secondary | ICD-10-CM | POA: Insufficient documentation

## 2014-01-24 DIAGNOSIS — J309 Allergic rhinitis, unspecified: Secondary | ICD-10-CM

## 2014-01-24 DIAGNOSIS — D649 Anemia, unspecified: Secondary | ICD-10-CM

## 2014-01-24 DIAGNOSIS — M1710 Unilateral primary osteoarthritis, unspecified knee: Secondary | ICD-10-CM

## 2014-01-24 DIAGNOSIS — R413 Other amnesia: Secondary | ICD-10-CM | POA: Insufficient documentation

## 2014-01-24 DIAGNOSIS — M25561 Pain in right knee: Secondary | ICD-10-CM

## 2014-01-24 DIAGNOSIS — I10 Essential (primary) hypertension: Secondary | ICD-10-CM

## 2014-01-24 DIAGNOSIS — M25579 Pain in unspecified ankle and joints of unspecified foot: Secondary | ICD-10-CM

## 2014-01-24 LAB — POCT CBC
Granulocyte percent: 63.1 %G (ref 37–80)
HCT, POC: 34.1 % — AB (ref 37.7–47.9)
Hemoglobin: 10.7 g/dL — AB (ref 12.2–16.2)
Lymph, poc: 1.3 (ref 0.6–3.4)
MCH, POC: 27 pg (ref 27–31.2)
MCHC: 31.3 g/dL — AB (ref 31.8–35.4)
MCV: 86.3 fL (ref 80–97)
MPV: 8.6 fL (ref 0–99.8)
POC Granulocyte: 2.4 (ref 2–6.9)
POC LYMPH PERCENT: 33.6 %L (ref 10–50)
Platelet Count, POC: 174 10*3/uL (ref 142–424)
RBC: 4 M/uL — AB (ref 4.04–5.48)
RDW, POC: 13.8 %
WBC: 3.8 10*3/uL — AB (ref 4.6–10.2)

## 2014-01-24 MED ORDER — LORATADINE 10 MG PO TABS: ORAL_TABLET | ORAL | Status: DC

## 2014-01-24 MED ORDER — DICLOFENAC SODIUM 1 % TD GEL: 2.0000 g | Freq: Four times a day (QID) | TRANSDERMAL | Status: AC

## 2014-01-24 NOTE — Progress Notes (Signed)
Patient ID: Michelle Rangel, female   DOB: 11-07-1931, 80 y.o.   MRN: [id number] SUBJECTIVE: CC: Chief Complaint  Patient presents with  . Follow-up    4 month follow up chronic problems.  dry skin around nose area . c/o lower abd pain usually in am's gets better thru out day .. c/o  rt foot pain  left hand pain  thinks losing memory   . Fall    states has fallen two times : concerned about memory. MMSE PERFROMED  18 OUT OF 30    HPI:  Patient is here for follow up of hypertension/Anemia: denies Headache;deniesChest Pain;denies weakness;denies Shortness of Breath or Orthopnea;denies Visual changes;denies palpitations;denies cough;denies pedal edema;denies symptoms of TIA or stroke; admits to Compliance with medications. denies Problems with medications.  Has had significantly worse memory loss in the last few months. Has had some episodes of hitting her head in the past but no LOC. Wanted to know if this could be related. Has not forgotten to close doors or leaving the stove on. What has been worse is forgetting conversations, content and events.  Rash/scales beside the nose. Has used a lot of facial cream  Right knee worse with pain and crackling and falling. Has a cane but left it at home.   Past Medical History  Diagnosis Date  . Hypertension   . Anemia   . Hypertension   . right knee    Past Surgical History  Procedure Laterality Date  . Knee surgery  2010    Left total knee arthroplasty in 2008 or 2009  . Joint replacement Left     knee   History   Social History  . Marital Status: Widowed    Spouse Name: N/A    Number of Children: N/A  . Years of Education: N/A   Occupational History  . Not on file.   Social History Main Topics  . Smoking status: Never Smoker   . Smokeless tobacco: Not on file  . Alcohol Use: No  . Drug Use: No  . Sexual Activity: No   Other Topics Concern  . Not on file   Social History Narrative  . No narrative on file   No family  history on file. Current Outpatient Prescriptions on File Prior to Visit  Medication Sig Dispense Refill  . folic acid (FOLVITE) 1 MG tablet Take 800 mg by mouth daily.       . hydrochlorothiazide (HYDRODIURIL) 25 MG tablet Take 1 tablet (25 mg total) by mouth daily.  90 tablet  3  . polyethylene glycol powder (GLYCOLAX/MIRALAX) powder Take 17 g by mouth 2 (two) times daily as needed.  3350 g  1  . glucosamine-chondroitin 500-400 MG tablet Take 1 tablet by mouth 3 (three) times daily.        Marland Kitchen starch (ANUSOL) 51 % suppository Place 1 suppository rectally as needed for pain.  24 suppository  0   No current facility-administered medications on file prior to visit.   Allergies  Allergen Reactions  . Robitussin [Guaifenesin]     There is no immunization history on file for this patient. Prior to Admission medications   Medication Sig Start Date End Date Taking? Authorizing Provider  folic acid (FOLVITE) 1 MG tablet Take 800 mg by mouth daily.    Yes Historical Provider, MD  hydrochlorothiazide (HYDRODIURIL) 25 MG tablet Take 1 tablet (25 mg total) by mouth daily. 07/04/13  Yes Vernie Shanks, MD  loratadine (CLARITIN) 10 MG tablet TAKE  ONE TABLET BY MOUTH ONCE DAILY 09/13/13  Yes Vernie Shanks, MD  polyethylene glycol powder (GLYCOLAX/MIRALAX) powder Take 17 g by mouth 2 (two) times daily as needed. 09/13/13  Yes Vernie Shanks, MD  glucosamine-chondroitin 500-400 MG tablet Take 1 tablet by mouth 3 (three) times daily.      Historical Provider, MD  starch (ANUSOL) 51 % suppository Place 1 suppository rectally as needed for pain. 09/13/13   Vernie Shanks, MD     ROS: As above in the HPI. All other systems are stable or negative.  OBJECTIVE: APPEARANCE:  Patient in no acute distress.The patient appeared well nourished and normally developed. Acyanotic. Waist: VITAL SIGNS:BP 154/83  Pulse 67  Temp(Src) 97.2 F (36.2 C) (Oral)  Ht 5' 2"  (1.575 m)  Wt 155 lb 6.4 oz (70.489 kg)  BMI  28.42 kg/m2  Obese AAF limping, unsteady gait   SKIN: warm and  Dry without , tattoos Scar left knee Scaly rask in the crease of both nares.   HEAD and Neck: without JVD, Head and scalp: normal Eyes:No scleral icterus. Fundi normal, eye movements normal. Ears: Auricle normal, canal normal, Tympanic membranes normal, insufflation normal. Nose: normal Throat: normal Neck & thyroid: normal  CHEST & LUNGS: Chest wall: normal Lungs: Clear  CVS: Reveals the PMI to be normally located. Regular rhythm, First and Second Heart sounds are normal,  absence of murmurs, rubs or gallops. Peripheral vasculature: Radial pulses: normal Dorsal pedis pulses: normal Posterior pulses: normal  ABDOMEN:  Appearance: Obese Benign, no organomegaly, no masses, no Abdominal Aortic enlargement. No Guarding , no rebound. No Bruits. Bowel sounds: normal  RECTAL: N/A GU: N/A  EXTREMETIES: nonedematous.  MUSCULOSKELETAL:  Spine: normal Joints: left knee TKR, right knee painfull on ROM with crepitus.unsteady gait due to limping, obesity and severe DJD of knee  NEUROLOGIC: oriented to place and person; nonfocal. MMSE 18/30  Results for orders placed in visit on 01/24/14  POCT CBC      Result Value Ref Range   WBC 3.8 (*) 4.6 - 10.2 K/uL   Lymph, poc 1.3  0.6 - 3.4   POC LYMPH PERCENT 33.6  10 - 50 %L   POC Granulocyte 2.4  2 - 6.9   Granulocyte percent 63.1  37 - 80 %G   RBC 4.0 (*) 4.04 - 5.48 M/uL   Hemoglobin 10.7 (*) 12.2 - 16.2 g/dL   HCT, POC 34.1 (*) 37.7 - 47.9 %   MCV 86.3  80 - 97 fL   MCH, POC 27.0  27 - 31.2 pg   MCHC 31.3 (*) 31.8 - 35.4 g/dL   RDW, POC 13.8     Platelet Count, POC 174.0  142 - 424 K/uL   MPV 8.6  0 - 99.8 fL    ASSESSMENT:  Pain in joint, ankle and foot - Plan: diclofenac sodium (VOLTAREN) 1 % GEL  Hypertension  Anemia  Memory impairment - Plan: POCT CBC, CMP14+EGFR, Vitamin B12, Folate, TSH  Arthritis of knee, right - Plan: DG Knee Complete 4  Views Right  Seborrhea  Constipation, slow transit  Knee pain, right - Plan: DG Knee Complete 4 Views Right  Seasonal allergic rhinitis - Plan: loratadine (CLARITIN) 10 MG tablet  PLAN: Recommend Orthopedics referral: patient hesitant. Wants to try using her cane and voltaren gel Recommend aricept and namenda: patient reluctant. She wants to wait on her labwork first. Recommend walker: patient prefers to try her cane on a regular basis.  Orders Placed  This Encounter  Procedures  . DG Knee Complete 4 Views Right    Standing Status: Future     Number of Occurrences: 1     Standing Expiration Date: 03/25/2015    Order Specific Question:  Reason for Exam (SYMPTOM  OR DIAGNOSIS REQUIRED)    Answer:  right knee pain and falling    Order Specific Question:  Preferred imaging location?    Answer:  Internal  . CMP14+EGFR  . Vitamin B12  . Folate  . TSH  . POCT CBC  [facility] reading (PRIMARY) by  Dr. Jacelyn Grip: advanced DJD of the right knee and TKR on the left.                                Refilled her loratadine.  Meds ordered this encounter  Medications  . diclofenac sodium (VOLTAREN) 1 % GEL    Sig: Apply 2 g topically 4 (four) times daily.    Dispense:  100 g    Refill:  1  . loratadine (CLARITIN) 10 MG tablet    Sig: TAKE ONE TABLET BY MOUTH ONCE DAILY    Dispense:  30 tablet    Refill:  5   Medications Discontinued During This Encounter  Medication Reason  . loratadine (CLARITIN) 10 MG tablet Reorder   Return in about 4 weeks (around 02/21/2014) for Recheck medical problems.  Mc Hollen P. Jacelyn Grip, M.D.

## 2014-01-25 LAB — CMP14+EGFR
ALT: 11 IU/L (ref 0–32)
AST: 22 IU/L (ref 0–40)
Albumin/Globulin Ratio: 1.3 (ref 1.1–2.5)
Albumin: 4.2 g/dL (ref 3.5–4.7)
Alkaline Phosphatase: 99 IU/L (ref 39–117)
BUN/Creatinine Ratio: 21 (ref 11–26)
BUN: 19 mg/dL (ref 8–27)
CO2: 28 mmol/L (ref 18–29)
Calcium: 9.6 mg/dL (ref 8.7–10.3)
Chloride: 98 mmol/L (ref 97–108)
Creatinine, Ser: 0.89 mg/dL (ref 0.57–1.00)
GFR calc Af Amer: 71 mL/min/{1.73_m2} (ref >59)
GFR calc non Af Amer: 61 mL/min/{1.73_m2} (ref >59)
Globulin, Total: 3.2 g/dL (ref 1.5–4.5)
Glucose: 88 mg/dL (ref 65–99)
Potassium: 3.9 mmol/L (ref 3.5–5.2)
Sodium: 141 mmol/L (ref 134–144)
Total Bilirubin: 0.6 mg/dL (ref 0.0–1.2)
Total Protein: 7.4 g/dL (ref 6.0–8.5)

## 2014-01-25 LAB — TSH: TSH: 2.3 u[IU]/mL (ref 0.450–4.500)

## 2014-01-25 LAB — VITAMIN B12: Vitamin B-12: 652 pg/mL (ref 211–946)

## 2014-01-25 LAB — FOLATE: Folate: >19.9 ng/mL (ref >3.0)

## 2014-01-28 ENCOUNTER — Telehealth: Payer: Self-pay | Admitting: *Deleted

## 2014-01-28 NOTE — Telephone Encounter (Signed)
Message copied by Bearl MulberryUTHERFORD, NATALIE K on Tue Jan 28, 2014  3:21 PM ------      Message from: Ileana LaddWONG, FRANCIS P      Created: Sun Jan 26, 2014  9:14 AM       Labs results are stable.      No changes in medications.      No change in recommendations.      No change in Follow up in 3 to 4 weeks and we will discuss the next step. I suspect that we will need a brain scan and medications for her memory loss. ------

## 2014-01-28 NOTE — Telephone Encounter (Signed)
Pt notified of results

## 2014-02-27 ENCOUNTER — Encounter: Payer: Self-pay | Admitting: Family Medicine

## 2014-02-27 ENCOUNTER — Ambulatory Visit (INDEPENDENT_AMBULATORY_CARE_PROVIDER_SITE_OTHER): Payer: Medicare Other | Admitting: Family Medicine

## 2014-02-27 VITALS — BP 138/76 | HR 68 | Temp 97.7°F | Ht 62.0 in | Wt 155.0 lb

## 2014-02-27 DIAGNOSIS — J302 Other seasonal allergic rhinitis: Secondary | ICD-10-CM

## 2014-02-27 DIAGNOSIS — M25579 Pain in unspecified ankle and joints of unspecified foot: Secondary | ICD-10-CM

## 2014-02-27 DIAGNOSIS — R413 Other amnesia: Secondary | ICD-10-CM

## 2014-02-27 DIAGNOSIS — I10 Essential (primary) hypertension: Secondary | ICD-10-CM

## 2014-02-27 DIAGNOSIS — D649 Anemia, unspecified: Secondary | ICD-10-CM

## 2014-02-27 DIAGNOSIS — J309 Allergic rhinitis, unspecified: Secondary | ICD-10-CM

## 2014-02-27 MED ORDER — LORATADINE 10 MG PO TABS: ORAL_TABLET | ORAL | Status: AC

## 2014-02-27 MED ORDER — HYDROCHLOROTHIAZIDE 25 MG PO TABS: 25.0000 mg | ORAL_TABLET | Freq: Every day | ORAL | Status: AC

## 2014-02-27 NOTE — Progress Notes (Signed)
Patient ID: Michelle Rangel, female   DOB: 08/09/1931, 80 y.o.   MRN: [id number] SUBJECTIVE: CC: Chief Complaint  Patient presents with  . Follow-up    4 week ck up states was given voltaren gel for knee but has not used it    HPI: Patient is here for follow up of hypertension: denies Headache;deniesChest Pain;denies weakness;denies Shortness of Breath or Orthopnea;denies Visual changes;denies palpitations;denies cough;denies pedal edema;denies symptoms of TIA or stroke; admits to Compliance with medications. denies Problems with medications.  Also here to follow up on the effects of voltaren gel. Right Knee is better and did not use the voltaren. Besides she is afraid of medicines.  Memory impairment is the same.doesn't want to be on Aricept.    Past Medical History  Diagnosis Date  . Hypertension   . Anemia   . Hypertension   . right knee    Past Surgical History  Procedure Laterality Date  . Knee surgery  2010    Left total knee arthroplasty in 2008 or 2009  . Joint replacement Left     knee   History   Social History  . Marital Status: Widowed    Spouse Name: N/A    Number of Children: N/A  . Years of Education: N/A   Occupational History  . Not on file.   Social History Main Topics  . Smoking status: Never Smoker   . Smokeless tobacco: Not on file  . Alcohol Use: No  . Drug Use: No  . Sexual Activity: No   Other Topics Concern  . Not on file   Social History Narrative  . No narrative on file   No family history on file. Current Outpatient Prescriptions on File Prior to Visit  Medication Sig Dispense Refill  . diclofenac sodium (VOLTAREN) 1 % GEL Apply 2 g topically 4 (four) times daily.  423 g  1  . folic acid (FOLVITE) 1 MG tablet Take 800 mg by mouth daily.       Marland Kitchen glucosamine-chondroitin 500-400 MG tablet Take 1 tablet by mouth 3 (three) times daily.        . polyethylene glycol powder (GLYCOLAX/MIRALAX) powder Take 17 g by mouth 2 (two) times  daily as needed.  3350 g  1  . starch (ANUSOL) 51 % suppository Place 1 suppository rectally as needed for pain.  24 suppository  0   No current facility-administered medications on file prior to visit.   Allergies  Allergen Reactions  . Robitussin [Guaifenesin]     There is no immunization history on file for this patient. Prior to Admission medications   Medication Sig Start Date End Date Taking? Authorizing Provider  diclofenac sodium (VOLTAREN) 1 % GEL Apply 2 g topically 4 (four) times daily. 01/24/14   Vernie Shanks, MD  folic acid (FOLVITE) 1 MG tablet Take 800 mg by mouth daily.     Historical Provider, MD  glucosamine-chondroitin 500-400 MG tablet Take 1 tablet by mouth 3 (three) times daily.      Historical Provider, MD  hydrochlorothiazide (HYDRODIURIL) 25 MG tablet Take 1 tablet (25 mg total) by mouth daily. 07/04/13   Vernie Shanks, MD  loratadine (CLARITIN) 10 MG tablet TAKE ONE TABLET BY MOUTH ONCE DAILY 01/24/14   Vernie Shanks, MD  polyethylene glycol powder (GLYCOLAX/MIRALAX) powder Take 17 g by mouth 2 (two) times daily as needed. 09/13/13   Vernie Shanks, MD  starch (ANUSOL) 51 % suppository Place 1 suppository rectally as  needed for pain. 09/13/13   Vernie Shanks, MD     ROS: As above in the HPI. All other systems are stable or negative.  OBJECTIVE: APPEARANCE:  Patient in no acute distress.The patient appeared well nourished and normally developed. Acyanotic. Waist: VITAL SIGNS:BP 138/76  Pulse 68  Temp(Src) 97.7 F (36.5 C) (Oral)  Ht 5' 2" (1.575 m)  Wt 155 lb (70.308 kg)  BMI 28.34 kg/m2  AAF short stature. Overweight  SKIN: warm and  Dry without overt rashes, tattoos and scars  HEAD and Neck: without JVD, Head and scalp: normal Eyes:No scleral icterus. Fundi normal, eye movements normal. Ears: Auricle normal, canal normal, Tympanic membranes normal, insufflation normal. Nose: normal Throat: normal Neck & thyroid: normal  CHEST &  LUNGS: Chest wall: normal Lungs: Clear  CVS: Reveals the PMI to be normally located. Regular rhythm, First and Second Heart sounds are normal,  absence of murmurs, rubs or gallops. Peripheral vasculature: Radial pulses: normal Dorsal pedis pulses: normal Posterior pulses: normal  ABDOMEN:  Appearance: normal Benign, no organomegaly, no masses, no Abdominal Aortic enlargement. No Guarding , no rebound. No Bruits. Bowel sounds: normal  RECTAL: N/A GU: N/A  EXTREMETIES: nonedematous.  MUSCULOSKELETAL:  Spine: normal Joints: right knee crepitus and reduced ROM. Left TKR.  NEUROLOGIC: oriented to time,place and person; nonfocal. Strength is normal Sensory is normal Reflexes are normal Cranial Nerves are normal. Results for orders placed in visit on 01/24/14  CMP14+EGFR      Result Value Ref Range   Glucose 88  65 - 99 mg/dL   BUN 19  8 - 27 mg/dL   Creatinine, Ser 0.89  0.57 - 1.00 mg/dL   GFR calc non Af Amer 61  >59 mL/min/1.73   GFR calc Af Amer 71  >59 mL/min/1.73   BUN/Creatinine Ratio 21  11 - 26   Sodium 141  134 - 144 mmol/L   Potassium 3.9  3.5 - 5.2 mmol/L   Chloride 98  97 - 108 mmol/L   CO2 28  18 - 29 mmol/L   Calcium 9.6  8.7 - 10.3 mg/dL   Total Protein 7.4  6.0 - 8.5 g/dL   Albumin 4.2  3.5 - 4.7 g/dL   Globulin, Total 3.2  1.5 - 4.5 g/dL   Albumin/Globulin Ratio 1.3  1.1 - 2.5   Total Bilirubin 0.6  0.0 - 1.2 mg/dL   Alkaline Phosphatase 99  39 - 117 IU/L   AST 22  0 - 40 IU/L   ALT 11  0 - 32 IU/L  VITAMIN B12      Result Value Ref Range   Vitamin B-12 652  211 - 946 pg/mL  FOLATE      Result Value Ref Range   Folate >19.9  >3.0 ng/mL  TSH      Result Value Ref Range   TSH 2.300  0.450 - 4.500 uIU/mL  POCT CBC      Result Value Ref Range   WBC 3.8 (*) 4.6 - 10.2 K/uL   Lymph, poc 1.3  0.6 - 3.4   POC LYMPH PERCENT 33.6  10 - 50 %L   POC Granulocyte 2.4  2 - 6.9   Granulocyte percent 63.1  37 - 80 %G   RBC 4.0 (*) 4.04 - 5.48 M/uL    Hemoglobin 10.7 (*) 12.2 - 16.2 g/dL   HCT, POC 34.1 (*) 37.7 - 47.9 %   MCV 86.3  80 - 97 fL   MCH,  POC 27.0  27 - 31.2 pg   MCHC 31.3 (*) 31.8 - 35.4 g/dL   RDW, POC 13.8     Platelet Count, POC 174.0  142 - 424 K/uL   MPV 8.6  0 - 99.8 fL    ASSESSMENT:  Pain in joint, ankle and foot  Hypertension  Anemia  Memory impairment  HTN (hypertension) - Plan: hydrochlorothiazide (HYDRODIURIL) 25 MG tablet  Seasonal allergic rhinitis - Plan: loratadine (CLARITIN) 10 MG tablet LEFT TKR. Right DJD.  PLAN: Weight loss. Diet  Keep active.  No orders of the defined types were placed in this encounter.   Meds ordered this encounter  Medications  . VOLTAREN 1 % GEL    Sig:   . hydrochlorothiazide (HYDRODIURIL) 25 MG tablet    Sig: Take 1 tablet (25 mg total) by mouth daily.    Dispense:  90 tablet    Refill:  3  . loratadine (CLARITIN) 10 MG tablet    Sig: TAKE ONE TABLET BY MOUTH ONCE DAILY    Dispense:  30 tablet    Refill:  5   Medications Discontinued During This Encounter  Medication Reason  . hydrochlorothiazide (HYDRODIURIL) 25 MG tablet Reorder  . loratadine (CLARITIN) 10 MG tablet Reorder   Return in about 4 months (around 06/29/2014) for Recheck medical problems.  Francis P. Jacelyn Grip, M.D.

## 2014-04-02 ENCOUNTER — Telehealth: Payer: Self-pay | Admitting: Family Medicine

## 2014-04-08 IMAGING — CR DG KNEE 1-2V*R*
2 series · 2 of 2 positions shown · non-contrast
Comparison: June 23, 2012.

CLINICAL DATA: Right knee pain.

EXAM:
RIGHT KNEE - 1-2 VIEW

[view not recorded (1 of 2)]
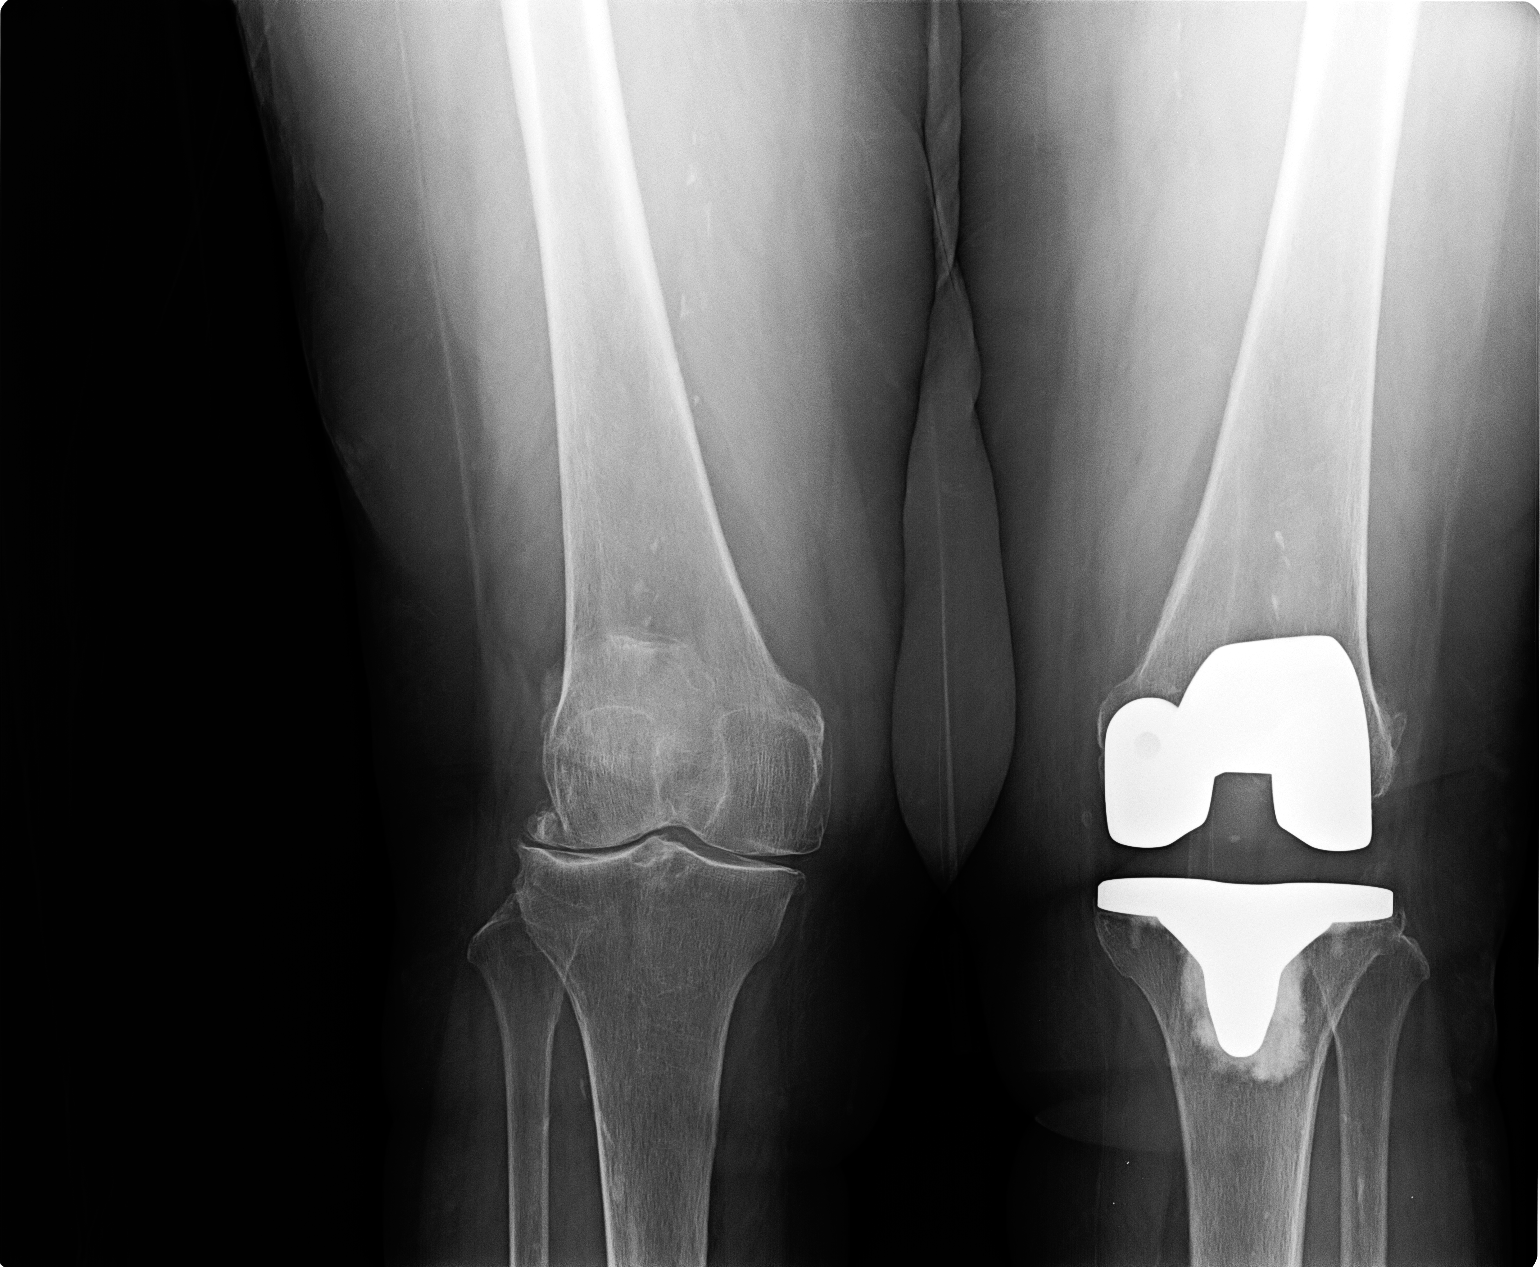

[view not recorded (2 of 2)]
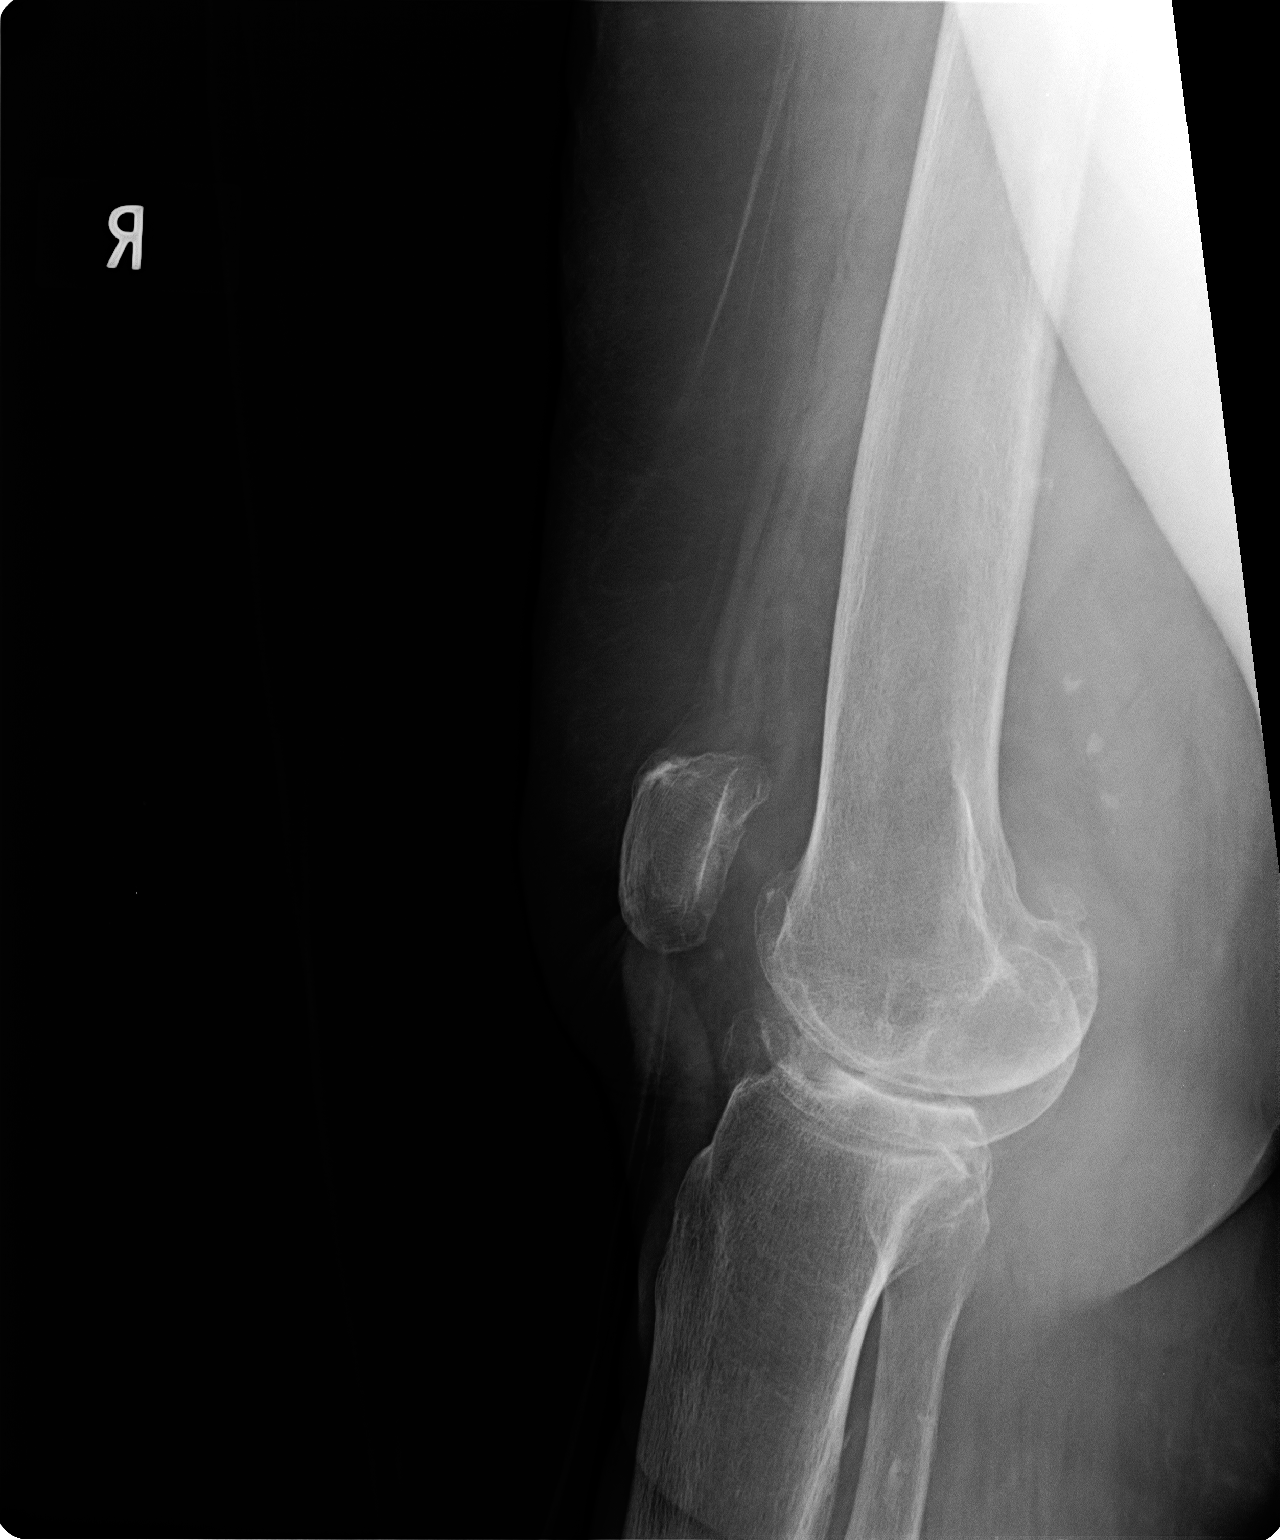

[2 of 2 positions shown; findings below may reference images not displayed]

FINDINGS: No fracture or dislocation is noted. Severe narrowing of the medial
and lateral joint spaces are noted with osteophyte formation. No
joint effusion is noted. Mild spurring of superior aspect of patella
is noted.
IMPRESSION: Severe degenerative joint disease. No acute abnormality seen in the
right knee.

## 2014-05-01 ENCOUNTER — Telehealth: Payer: Self-pay | Admitting: Family Medicine
